# Patient Record
Sex: Male | Born: 1995 | Race: White | Hispanic: No | Marital: Single | Smoking: Never smoker
Health system: Southern US, Community
[De-identification: ages and names within clinical notes are randomized; demographics above are authoritative.]

---

## 2018-11-28 ENCOUNTER — Ambulatory Visit (HOSPITAL_COMMUNITY)
Admission: EM | Admit: 2018-11-28 | Discharge: 2018-11-28 | Disposition: A | Discharge status: Home / Self Care | Payer: Federal, State, Local not specified - PPO | Attending: Family Medicine | Admitting: Family Medicine

## 2018-11-28 ENCOUNTER — Encounter (HOSPITAL_COMMUNITY): Payer: Self-pay

## 2018-11-28 DIAGNOSIS — K219 Gastro-esophageal reflux disease without esophagitis: Secondary | ICD-10-CM | POA: Diagnosis not present

## 2018-11-28 MED ORDER — OMEPRAZOLE 20 MG PO CPDR: 20.0000 mg | DELAYED_RELEASE_CAPSULE | Freq: Every day | ORAL | 1 refills | Status: AC

## 2018-11-28 MED ORDER — LIDOCAINE VISCOUS HCL 2 % MT SOLN
OROMUCOSAL | Status: AC
  Filled 2018-11-28: qty 15

## 2018-11-28 MED ORDER — ONDANSETRON 4 MG PO TBDP: 4.0000 mg | ORAL_TABLET | Freq: Three times a day (TID) | ORAL | 0 refills | Status: AC | PRN

## 2018-11-28 MED ORDER — ALUM & MAG HYDROXIDE-SIMETH 200-200-20 MG/5ML PO SUSP
ORAL | Status: AC
  Filled 2018-11-28: qty 30

## 2018-11-28 MED ORDER — ALUM & MAG HYDROXIDE-SIMETH 200-200-20 MG/5ML PO SUSP
30.0000 mL | Freq: Once | ORAL | Status: AC
  Administered 2018-11-28: 30 mL via ORAL

## 2018-11-28 MED ORDER — LIDOCAINE VISCOUS HCL 2 % MT SOLN
15.0000 mL | Freq: Once | OROMUCOSAL | Status: AC
  Administered 2018-11-28: 15 mL via ORAL

## 2018-11-28 NOTE — Discharge Instructions (Addendum)
Omeprazole daily with a full glass of water 30 to 60 minutes before meal Avoid spicy, greasy foods.  Caffeine and chocolate can also flare acid reflux. GI cocktail given in clinic for symptoms Zofran also prescribed for nausea, vomiting as needed If your symptoms continue or worsen in the next week need to go the hospital for an ultrasound of the abdomen

## 2018-11-28 NOTE — ED Provider Notes (Signed)
[facility]    CSN: [id number] Arrival date & time: 11/28/18  6270     History   Chief Complaint Chief Complaint  Patient presents with  . Emesis    HPI Wayne Russell is a 23 y.o. male.   Patient is a 23 year old male who presents with approximately 3 days of intermittent emesis.  This is mostly after eating certain meals.  He has been able to hold down crackers and fluids.  He does have a past medical history of acid reflux.  He has been taking Tums without much relief of his symptoms.  He was concerned because the symptoms persisted over the past 3 days.  Denies associated fever, chills, night sweats.  Denies any recent sick contacts or recent traveling.  Last bowel movement was this morning.  Denies any diarrhea.  He is currently without abdominal pain. Denies drinking alcohol.   ROS per HPI      History reviewed. No pertinent past medical history.  There are no active problems to display for this patient.   History reviewed. No pertinent surgical history.     Home Medications    Prior to Admission medications   Medication Sig Start Date End Date Taking? Authorizing Provider  omeprazole (PRILOSEC) 20 MG capsule Take 1 capsule (20 mg total) by mouth daily. 11/28/18   Dahlia Byes A, NP  ondansetron (ZOFRAN ODT) 4 MG disintegrating tablet Take 1 tablet (4 mg total) by mouth every 8 (eight) hours as needed for nausea or vomiting. 11/28/18   Janace Aris, NP    Family History Family History  Problem Relation Age of Onset  . Healthy Mother   . Diabetes Father     Social History Social History   Tobacco Use  . Smoking status: Never Smoker  . Smokeless tobacco: Never Used  Substance Use Topics  . Alcohol use: Never    Frequency: Never  . Drug use: Never     Allergies   Patient has no known allergies.   Review of Systems Review of Systems   Physical Exam Triage Vital Signs ED Triage Vitals  Enc Vitals Group     BP 11/28/18 1013 128/83       Pulse Rate 11/28/18 1013 79     Resp 11/28/18 1013 19     Temp 11/28/18 1013 98.1 F (36.7 C)     Temp src --      SpO2 11/28/18 1013 98 %     Weight --      Height --      Head Circumference --      Peak Flow --      Pain Score 11/28/18 1010 0     Pain Loc --      Pain Edu? --      Excl. in GC? --    No data found.  Updated Vital Signs BP 128/83   Pulse 79   Temp 98.1 F (36.7 C)   Resp 19   SpO2 98%   Visual Acuity Right Eye Distance:   Left Eye Distance:   Bilateral Distance:    Right Eye Near:   Left Eye Near:    Bilateral Near:     Physical Exam Vitals signs and nursing note reviewed.  Constitutional:      General: He is not in acute distress.    Appearance: Normal appearance. He is well-developed. He is not ill-appearing, toxic-appearing or diaphoretic.  HENT:     Head: Normocephalic and atraumatic.  Nose: Nose normal.  Eyes:     Conjunctiva/sclera: Conjunctivae normal.  Pulmonary:     Effort: Pulmonary effort is normal.  Abdominal:     General: Bowel sounds are decreased.     Palpations: Abdomen is soft.     Tenderness: There is abdominal tenderness in the right upper quadrant and epigastric area. There is no right CVA tenderness, left CVA tenderness, guarding or rebound. Negative signs include Murphy's sign.     Hernia: No hernia is present.  Musculoskeletal: Normal range of motion.  Skin:    General: Skin is warm and dry.     Findings: No rash.  Neurological:     Mental Status: He is alert.  Psychiatric:        Mood and Affect: Mood normal.      UC Treatments / Results  Labs (all labs ordered are listed, but only abnormal results are displayed) Labs Reviewed - No data to display  EKG None  Radiology No results found.  Procedures Procedures (including critical care time)  Medications Ordered in UC Medications  alum & mag hydroxide-simeth (MAALOX/MYLANTA) 200-200-20 MG/5ML suspension 30 mL (30 mLs Oral Given 11/28/18 1037)     And  lidocaine (XYLOCAINE) 2 % viscous mouth solution 15 mL (15 mLs Oral Given 11/28/18 1037)    Initial Impression / Assessment and Plan / UC Course  I have reviewed the triage vital signs and the nursing notes.  Pertinent labs & imaging results that were available during my care of the patient were reviewed by me and considered in my medical decision making (see chart for details).     Pt is a 23 year old male who presents with intermittent vomiting after certain meals.  On exam he has some epigastric and right upper quadrant tenderness. Most likely his symptoms are related to GERD. Other differential include cholecystitis. GI cocktail given in clinic with some relief Patient not currently having any abdominal pain We will do trial of omeprazole daily and Zofran as needed for nausea, vomiting If symptoms continue or worsen he will need to go to ER for further imaging and management Patient understanding and agree to plan Final Clinical Impressions(s) / UC Diagnoses   Final diagnoses:  Gastroesophageal reflux disease without esophagitis     Discharge Instructions     Omeprazole daily with a full glass of water 30 to 60 minutes before meal Avoid spicy, greasy foods.  Caffeine and chocolate can also flare acid reflux. GI cocktail given in clinic for symptoms Zofran also prescribed for nausea, vomiting as needed If your symptoms continue or worsen in the next week need to go the hospital for an ultrasound of the abdomen    ED Prescriptions    Medication Sig Dispense Auth. Provider   omeprazole (PRILOSEC) 20 MG capsule Take 1 capsule (20 mg total) by mouth daily. 30 capsule Torren Maffeo A, NP   ondansetron (ZOFRAN ODT) 4 MG disintegrating tablet Take 1 tablet (4 mg total) by mouth every 8 (eight) hours as needed for nausea or vomiting. 20 tablet Janace Aris, NP     Controlled Substance Prescriptions [place] Controlled Substance Registry consulted? no   Janace Aris,  NP 11/28/18 1108

## 2018-11-28 NOTE — ED Triage Notes (Signed)
Pt presents with complaints of emesis x 3 days. Patient denies any other symptoms. States it started before playing a basket ball down. Reports being able to hold small snacks down, but if he tries to eat a full meal he throws up.

## 2018-11-29 ENCOUNTER — Other Ambulatory Visit: Payer: Self-pay | Admitting: Gastroenterology

## 2018-11-29 DIAGNOSIS — R1011 Right upper quadrant pain: Secondary | ICD-10-CM

## 2019-03-23 ENCOUNTER — Other Ambulatory Visit: Payer: Self-pay

## 2019-03-23 ENCOUNTER — Emergency Department (HOSPITAL_COMMUNITY)
Admission: EM | Admit: 2019-03-23 | Discharge: 2019-03-23 | Disposition: A | Discharge status: Home / Self Care | Payer: No Typology Code available for payment source | Attending: Emergency Medicine | Admitting: Emergency Medicine

## 2019-03-23 ENCOUNTER — Encounter (HOSPITAL_COMMUNITY): Payer: Self-pay

## 2019-03-23 ENCOUNTER — Emergency Department (HOSPITAL_COMMUNITY): Payer: No Typology Code available for payment source

## 2019-03-23 DIAGNOSIS — Z87891 Personal history of nicotine dependence: Secondary | ICD-10-CM | POA: Insufficient documentation

## 2019-03-23 DIAGNOSIS — Y990 Civilian activity done for income or pay: Secondary | ICD-10-CM | POA: Diagnosis not present

## 2019-03-23 DIAGNOSIS — Y929 Unspecified place or not applicable: Secondary | ICD-10-CM | POA: Insufficient documentation

## 2019-03-23 DIAGNOSIS — Y9389 Activity, other specified: Secondary | ICD-10-CM | POA: Insufficient documentation

## 2019-03-23 DIAGNOSIS — W228XXA Striking against or struck by other objects, initial encounter: Secondary | ICD-10-CM | POA: Diagnosis not present

## 2019-03-23 DIAGNOSIS — Z79899 Other long term (current) drug therapy: Secondary | ICD-10-CM | POA: Insufficient documentation

## 2019-03-23 DIAGNOSIS — S060X0A Concussion without loss of consciousness, initial encounter: Secondary | ICD-10-CM | POA: Insufficient documentation

## 2019-03-23 DIAGNOSIS — S0990XA Unspecified injury of head, initial encounter: Secondary | ICD-10-CM | POA: Diagnosis present

## 2019-03-23 MED ORDER — IBUPROFEN 600 MG PO TABS: 600.0000 mg | ORAL_TABLET | Freq: Four times a day (QID) | ORAL | 0 refills | Status: AC | PRN

## 2019-03-23 MED ORDER — ACETAMINOPHEN 500 MG PO TABS: 500.0000 mg | ORAL_TABLET | Freq: Four times a day (QID) | ORAL | 0 refills | Status: AC | PRN

## 2019-03-23 NOTE — ED Triage Notes (Signed)
Pt here today after having small paint cans fall on his head while reaching for a box of them yesterday. Pt reports intermittent headache and photophobia since. Pt denies LOC, blood thinners, and falling. Denies blurred vision and nausea.

## 2019-03-23 NOTE — Discharge Instructions (Addendum)
Alternate ibuprofen and Tylenol as prescribed for headache.  Please follow-up with Dr. Tamala Julian at the concussion clinic for concussion testing and treatment.  Make sure to get plenty of rest and drink plenty of fluids.  Try to be in the dark as much as possible.  Avoid looking at screens such as your phone, tablet, television as well as reading.  Please return to the emergency department if you develop any new or worsening symptoms.

## 2019-03-23 NOTE — ED Provider Notes (Signed)
[facility]-EMERGENCY DEPT Provider Note   CSN: [id number] Arrival date & time: 03/23/19  1038    History   Chief Complaint Chief Complaint  Patient presents with  . Head Injury    HPI Wayne Russell is a 23 y.o. male who is previously healthy who presents following head injury that occurred yesterday.  Patient was at work and paint cans fell on his head.  He reports he stumbled afterward, but he did not completely lose consciousness.  He reports having a splitting headache on the right side of his head since that has been minimally improved with Advil.  He has had associated photophobia.  He denies any vision changes, loss of consciousness, numbness or tingling, nausea or vomiting, or neck pain.     HPI  History reviewed. No pertinent past medical history.  There are no active problems to display for this patient.   History reviewed. No pertinent surgical history.      Home Medications    Prior to Admission medications   Medication Sig Start Date End Date Taking? Authorizing Provider  acetaminophen (TYLENOL) 500 MG tablet Take 1 tablet (500 mg total) by mouth every 6 (six) hours as needed. 03/23/19   Sherline Eberwein, Waylan BogaAlexandra M, PA-C  ibuprofen (ADVIL) 600 MG tablet Take 1 tablet (600 mg total) by mouth every 6 (six) hours as needed. 03/23/19   Meliana Canner, Waylan BogaAlexandra M, PA-C  omeprazole (PRILOSEC) 20 MG capsule Take 1 capsule (20 mg total) by mouth daily. 11/28/18   Dahlia ByesBast, Traci A, NP  ondansetron (ZOFRAN ODT) 4 MG disintegrating tablet Take 1 tablet (4 mg total) by mouth every 8 (eight) hours as needed for nausea or vomiting. 11/28/18   Janace ArisBast, Traci A, NP    Family History Family History  Problem Relation Age of Onset  . Healthy Mother   . Diabetes Father     Social History Social History   Tobacco Use  . Smoking status: Never Smoker  . Smokeless tobacco: Never Used  Substance Use Topics  . Alcohol use: Never    Frequency: Never  . Drug use: Never      Allergies   Patient has no known allergies.   Review of Systems Review of Systems  Constitutional: Negative for chills and fever.  HENT: Negative for facial swelling and sore throat.   Eyes: Positive for photophobia. Negative for visual disturbance.  Respiratory: Negative for shortness of breath.   Cardiovascular: Negative for chest pain.  Gastrointestinal: Negative for abdominal pain, nausea and vomiting.  Genitourinary: Negative for dysuria.  Musculoskeletal: Negative for back pain.  Skin: Negative for rash and wound.  Neurological: Positive for headaches. Negative for numbness.  Psychiatric/Behavioral: The patient is not nervous/anxious.      Physical Exam Updated Vital Signs BP 122/72 (BP Location: Left Arm)   Pulse (!) 53   Temp 98.5 F (36.9 C) (Oral)   Resp 16   Ht 5\' 9"  (1.753 m)   Wt 96.6 kg   SpO2 100%   BMI 31.45 kg/m   Physical Exam Vitals signs and nursing note reviewed.  Constitutional:      General: He is not in acute distress.    Appearance: He is well-developed. He is not diaphoretic.  HENT:     Head: Normocephalic and atraumatic.      Mouth/Throat:     Pharynx: No oropharyngeal exudate.  Eyes:     General: No scleral icterus.       Right eye: No discharge.  Left eye: No discharge.     Conjunctiva/sclera: Conjunctivae normal.     Pupils: Pupils are equal, round, and reactive to light.  Neck:     Musculoskeletal: Normal range of motion and neck supple.     Thyroid: No thyromegaly.  Cardiovascular:     Rate and Rhythm: Normal rate and regular rhythm.     Heart sounds: Normal heart sounds. No murmur. No friction rub. No gallop.   Pulmonary:     Effort: Pulmonary effort is normal. No respiratory distress.     Breath sounds: Normal breath sounds. No stridor. No wheezing or rales.  Abdominal:     General: Bowel sounds are normal. There is no distension.     Palpations: Abdomen is soft.     Tenderness: There is no abdominal tenderness.  There is no guarding or rebound.  Lymphadenopathy:     Cervical: No cervical adenopathy.  Skin:    General: Skin is warm and dry.     Coloration: Skin is not pale.     Findings: No rash.  Neurological:     Mental Status: He is alert.     Coordination: Coordination normal.     Comments: CN 3-12 intact; normal sensation throughout; 5/5 strength in all 4 extremities; equal bilateral grip strength; no ataxia on finger-to-nose      ED Treatments / Results  Labs (all labs ordered are listed, but only abnormal results are displayed) Labs Reviewed  RAPID URINE DRUG SCREEN, HOSP PERFORMED    EKG None  Radiology Ct Head Wo Contrast  Result Date: 03/23/2019 CLINICAL DATA:  Intermittent headache since small paint cans fell on his head yesterday. EXAM: CT HEAD WITHOUT CONTRAST TECHNIQUE: Contiguous axial images were obtained from the base of the skull through the vertex without intravenous contrast. COMPARISON:  None. FINDINGS: Brain: No evidence of acute infarction, hemorrhage, hydrocephalus, extra-axial collection or mass lesion/mass effect. Vascular: No hyperdense vessel or unexpected calcification. Skull: Normal. Negative for fracture or focal lesion. Sinuses/Orbits: No acute finding. Other: None. IMPRESSION: 1. Normal noncontrast head CT. Electronically Signed   By: Obie DredgeWilliam T Derry M.D.   On: 03/23/2019 13:37    Procedures Procedures (including critical care time)  Medications Ordered in ED Medications - No data to display   Initial Impression / Assessment and Plan / ED Course  I have reviewed the triage vital signs and the nursing notes.  Pertinent labs & imaging results that were available during my care of the patient were reviewed by me and considered in my medical decision making (see chart for details).        Patient presenting with headache and photophobia after getting hit in the head yesterday with cans.  Patient having significant headache persistent since yesterday.   CT ordered bleeding.  CT head is negative.  Concussion suspected.  Postconcussion syndrome discussed with the patient and activity modification.  Patient discharged home with ibuprofen, Tylenol.  Patient to follow-up with concussion clinic.  Return precautions discussed.  Patient understands and agrees with plan.  Patient vital stable throughout ED course and discharged in satisfactory condition.  Final Clinical Impressions(s) / ED Diagnoses   Final diagnoses:  Concussion without loss of consciousness, initial encounter    ED Discharge Orders         Ordered    ibuprofen (ADVIL) 600 MG tablet  Every 6 hours PRN     03/23/19 1412    acetaminophen (TYLENOL) 500 MG tablet  Every 6 hours PRN  03/23/19 [place], [place], PA-C 03/23/19 [place], [place], DO 03/27/19 1442

## 2019-07-07 ENCOUNTER — Other Ambulatory Visit: Payer: Self-pay

## 2019-07-07 DIAGNOSIS — Z20822 Contact with and (suspected) exposure to covid-19: Secondary | ICD-10-CM

## 2019-07-08 LAB — NOVEL CORONAVIRUS, NAA: SARS-CoV-2, NAA: NOT DETECTED

## 2019-10-24 ENCOUNTER — Ambulatory Visit: Payer: Federal, State, Local not specified - PPO | Attending: Internal Medicine

## 2019-10-24 DIAGNOSIS — Z20822 Contact with and (suspected) exposure to covid-19: Secondary | ICD-10-CM

## 2019-10-25 LAB — NOVEL CORONAVIRUS, NAA: SARS-CoV-2, NAA: NOT DETECTED

## 2020-05-19 IMAGING — CT CT HEAD WITHOUT CONTRAST
3 of 4 series · 14 of 47 positions shown, 16 images · non-contrast
Comparison: None.

CLINICAL DATA: Intermittent headache since small paint cans fell on
his head yesterday.

EXAM:
CT HEAD WITHOUT CONTRAST
TECHNIQUE: Contiguous axial images were obtained from the base of the skull
through the vertex without intravenous contrast.

[Series 2: head wo · axial · 0.47mm/px · z∈[-160,-40]mm · 8 of 30 slices shown, 10 images]
[im 3/30  brain]
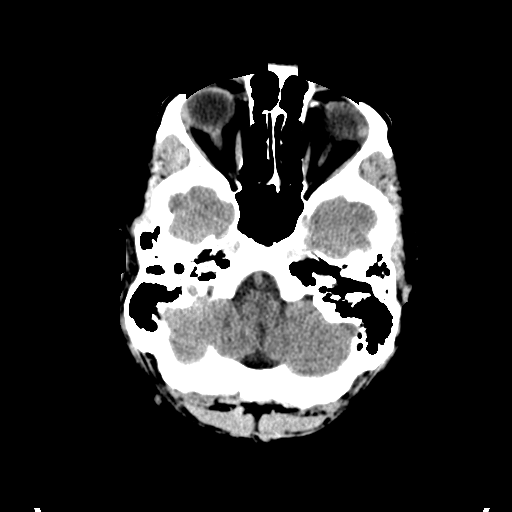
[im 3/30  bone]
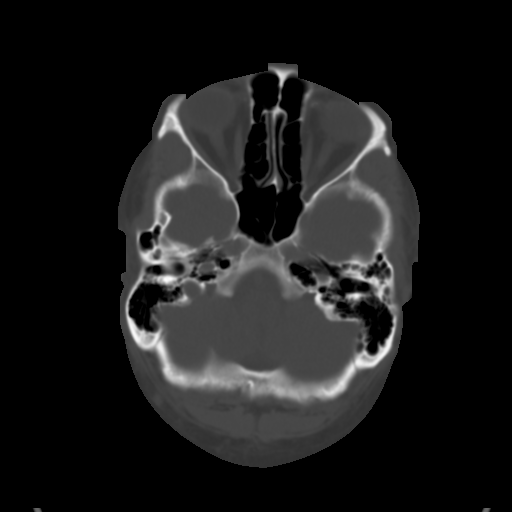
[im 6/30  brain]
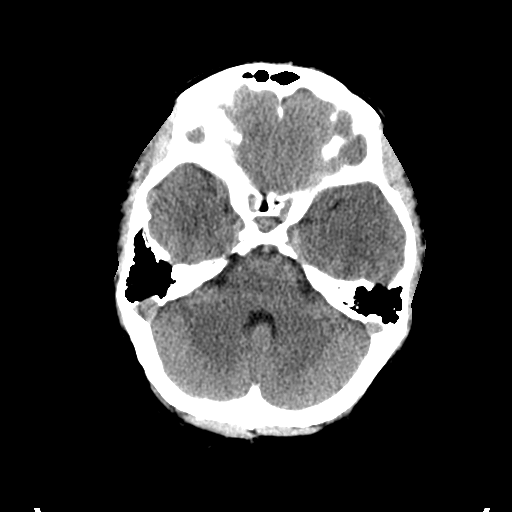
[im 9/30  brain]
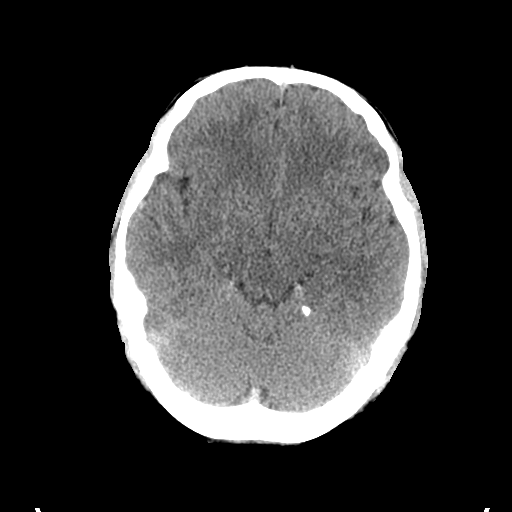
[im 12/30  brain]
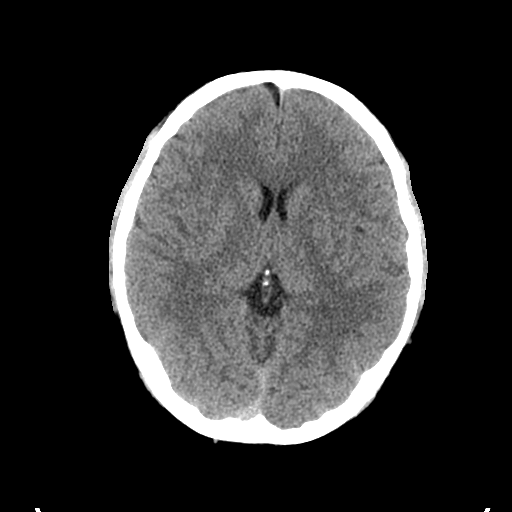
[im 18/30  brain]
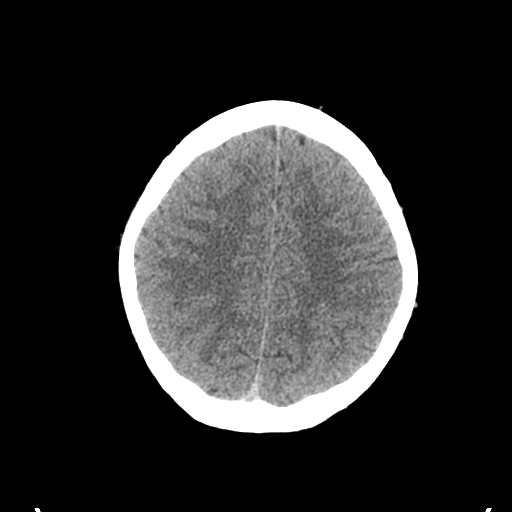
[im 18/30  bone]
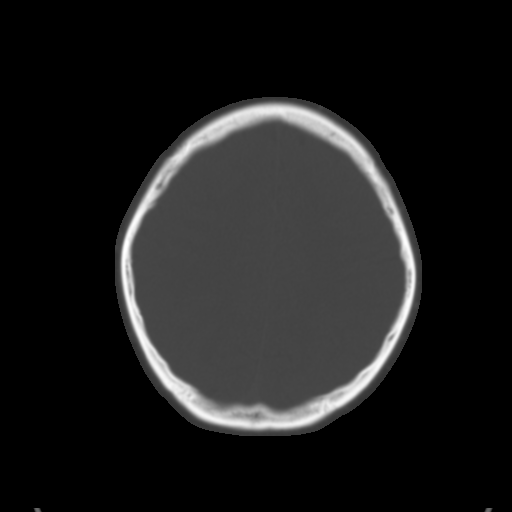
[im 21/30  brain]
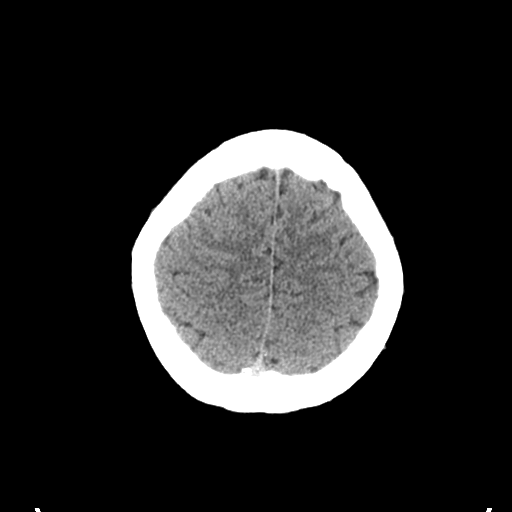
[im 24/30  brain]
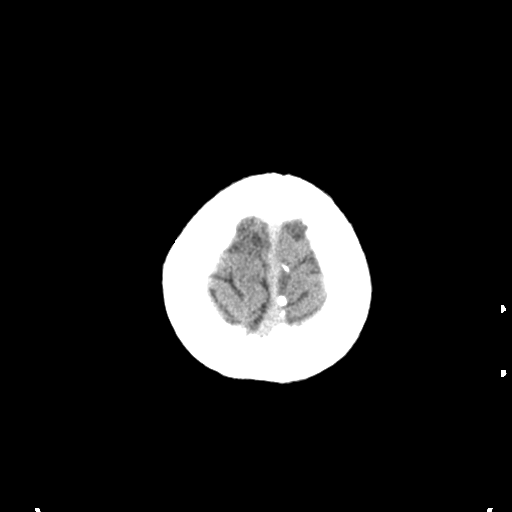
[im 27/30  brain]
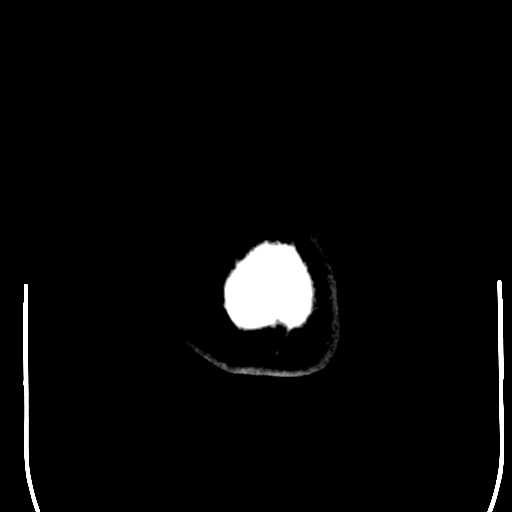

[Series 5: coronal soft tissue · coronal · 0.29mm/px · 3 of 83 slices shown]
[im 28/83  brain]
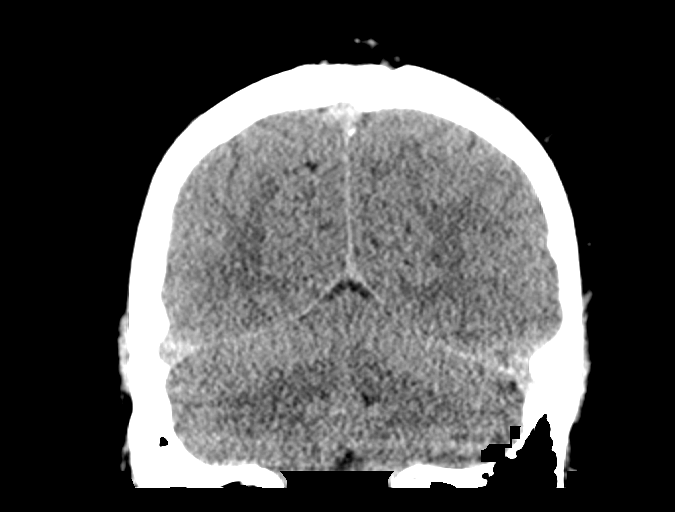
[im 37/83  brain]
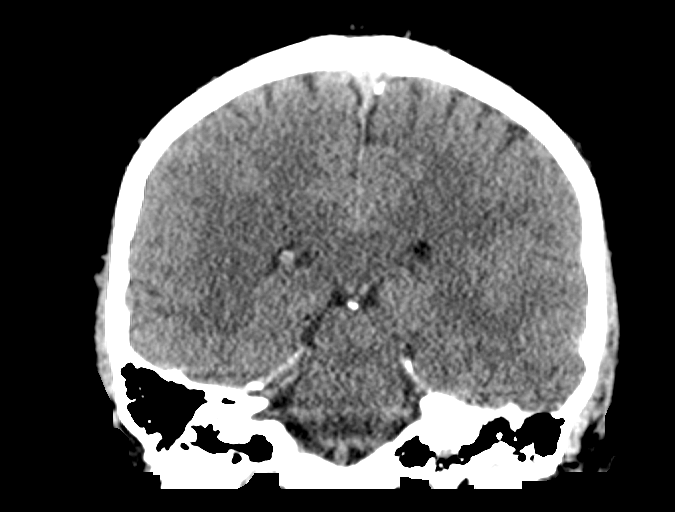
[im 46/83  brain]
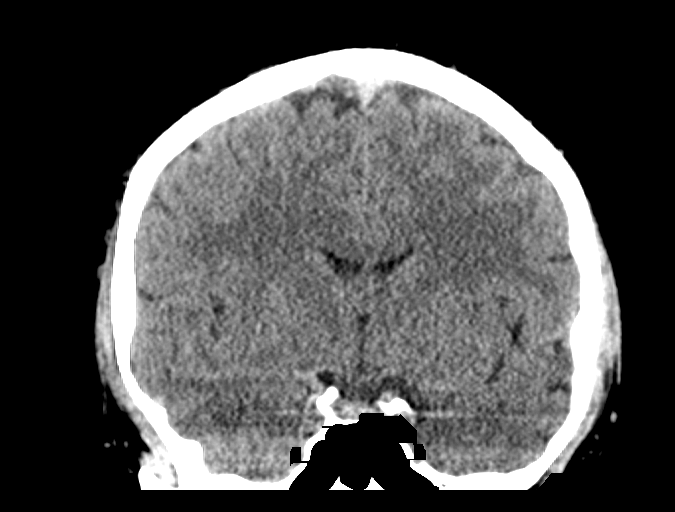

[Series 6: sagittal soft tissue · sagittal · 0.29mm/px · 3 of 63 slices shown]
[im 21/63  brain]
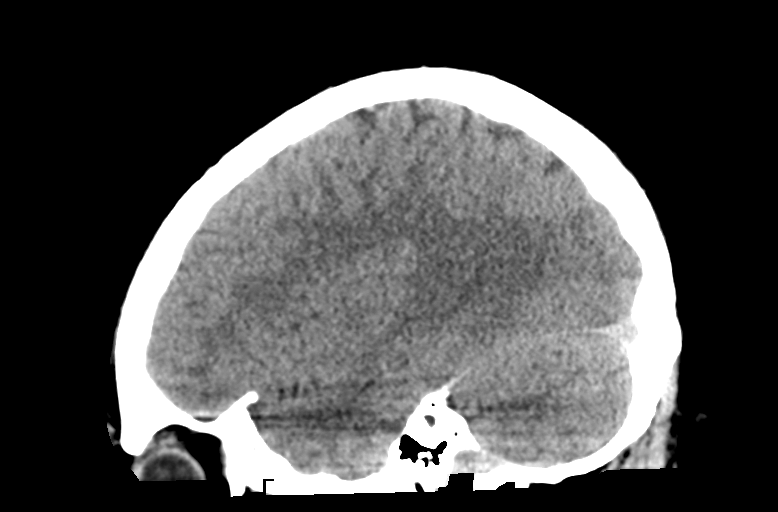
[im 32/63  brain]
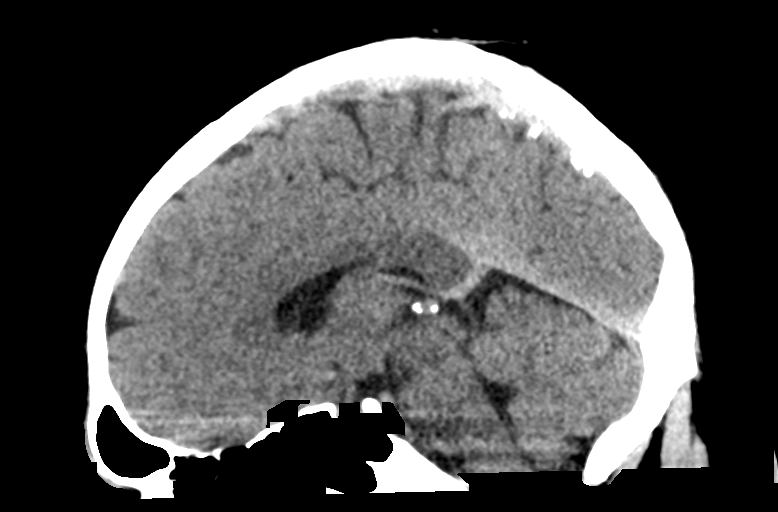
[im 42/63  brain]
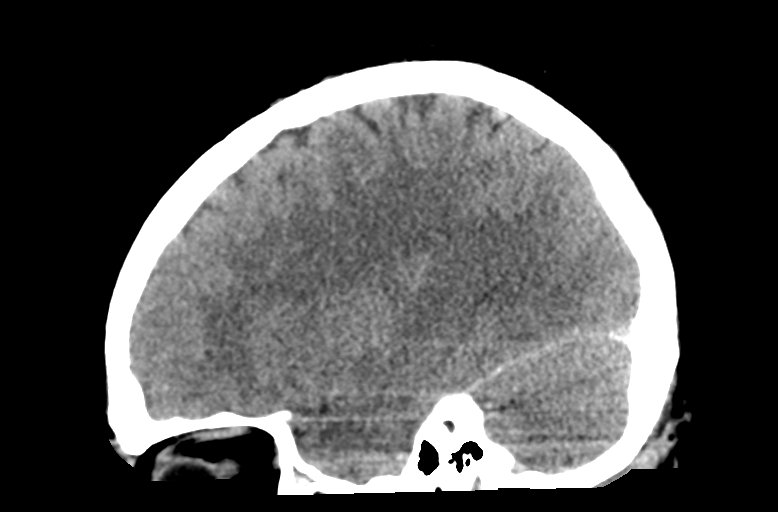

[14 of 47 positions shown; findings below may reference images not displayed]

FINDINGS: Brain: No evidence of acute infarction, hemorrhage, hydrocephalus,
extra-axial collection or mass lesion/mass effect.

Vascular: No hyperdense vessel or unexpected calcification.

Skull: Normal. Negative for fracture or focal lesion.

Sinuses/Orbits: No acute finding.

Other: None.
IMPRESSION: 1. Normal noncontrast head CT.
